# Patient Record
Sex: Female | Born: 1980 | Race: White | Hispanic: No | Marital: Married
Health system: Southern US, Community
[De-identification: ages and names within clinical notes are randomized; demographics above are authoritative.]

---

## 2009-09-23 ENCOUNTER — Emergency Department: Payer: Self-pay | Admitting: Emergency Medicine

## 2012-05-14 ENCOUNTER — Emergency Department: Payer: Self-pay | Admitting: Emergency Medicine

## 2012-09-16 ENCOUNTER — Ambulatory Visit: Payer: Self-pay | Admitting: Obstetrics & Gynecology

## 2012-09-24 ENCOUNTER — Ambulatory Visit: Payer: Self-pay | Admitting: Obstetrics & Gynecology

## 2012-10-11 ENCOUNTER — Ambulatory Visit: Payer: Self-pay | Admitting: Physician Assistant

## 2012-11-12 ENCOUNTER — Ambulatory Visit: Payer: Self-pay | Admitting: Physician Assistant

## 2013-01-01 ENCOUNTER — Emergency Department: Payer: Self-pay | Admitting: Internal Medicine

## 2013-01-01 LAB — URINALYSIS, COMPLETE
Nitrite: NEGATIVE
Ph: 5 (ref 4.5–8.0)
RBC,UR: 8 /HPF (ref 0–5)
Specific Gravity: 1.019 (ref 1.003–1.030)
Squamous Epithelial: 15
WBC UR: 11 /HPF (ref 0–5)

## 2013-01-01 LAB — COMPREHENSIVE METABOLIC PANEL
Albumin: 4.3 g/dL (ref 3.4–5.0)
Alkaline Phosphatase: 78 U/L (ref 50–136)
Anion Gap: 9 (ref 7–16)
BUN: 9 mg/dL (ref 7–18)
Bilirubin,Total: 0.5 mg/dL (ref 0.2–1.0)
Calcium, Total: 8.9 mg/dL (ref 8.5–10.1)
Chloride: 107 mmol/L (ref 98–107)
Creatinine: 0.73 mg/dL (ref 0.60–1.30)
EGFR (African American): >60
EGFR (Non-African Amer.): >60
Glucose: 92 mg/dL (ref 65–99)
Osmolality: 278 (ref 275–301)
SGOT(AST): 26 U/L (ref 15–37)
SGPT (ALT): 19 U/L (ref 12–78)
Total Protein: 8.1 g/dL (ref 6.4–8.2)

## 2013-01-01 LAB — DRUG SCREEN, URINE
Amphetamines, Ur Screen: NEGATIVE (ref ?–1000)
Benzodiazepine, Ur Scrn: NEGATIVE (ref ?–200)
Cannabinoid 50 Ng, Ur ~~LOC~~: NEGATIVE (ref ?–50)
Methadone, Ur Screen: NEGATIVE (ref ?–300)
Opiate, Ur Screen: NEGATIVE (ref ?–300)

## 2013-01-01 LAB — ETHANOL: Ethanol %: <0.003 % (ref 0.000–0.080)

## 2013-01-01 LAB — CBC
MCH: 27 pg (ref 26.0–34.0)
MCV: 83 fL (ref 80–100)
Platelet: 255 10*3/uL (ref 150–440)
RBC: 4.83 10*6/uL (ref 3.80–5.20)

## 2013-01-01 LAB — SALICYLATE LEVEL: Salicylates, Serum: <1.7 mg/dL

## 2013-02-01 ENCOUNTER — Ambulatory Visit: Payer: Self-pay | Admitting: Physician Assistant

## 2013-02-04 ENCOUNTER — Emergency Department: Payer: Self-pay | Admitting: Unknown Physician Specialty

## 2013-02-04 LAB — URINALYSIS, COMPLETE
Bacteria: NONE SEEN
Blood: NEGATIVE
Glucose,UR: NEGATIVE mg/dL (ref 0–75)
Nitrite: NEGATIVE
RBC,UR: 2 /HPF (ref 0–5)
Specific Gravity: 1.033 (ref 1.003–1.030)
WBC UR: 6 /HPF (ref 0–5)

## 2013-02-04 LAB — COMPREHENSIVE METABOLIC PANEL
Albumin: 3.6 g/dL (ref 3.4–5.0)
Alkaline Phosphatase: 49 U/L — ABNORMAL LOW (ref 50–136)
BUN: 11 mg/dL (ref 7–18)
Bilirubin,Total: 0.3 mg/dL (ref 0.2–1.0)
Calcium, Total: 8.4 mg/dL — ABNORMAL LOW (ref 8.5–10.1)
Creatinine: 0.69 mg/dL (ref 0.60–1.30)
EGFR (Non-African Amer.): >60
Glucose: 101 mg/dL — ABNORMAL HIGH (ref 65–99)
Osmolality: 281 (ref 275–301)
Potassium: 4 mmol/L (ref 3.5–5.1)
SGPT (ALT): 19 U/L (ref 12–78)
Sodium: 141 mmol/L (ref 136–145)

## 2013-02-04 LAB — PREGNANCY, URINE: Pregnancy Test, Urine: NEGATIVE m[IU]/mL

## 2013-02-04 LAB — CBC
HCT: 36.8 % (ref 35.0–47.0)
HGB: 11.9 g/dL — ABNORMAL LOW (ref 12.0–16.0)
MCH: 27.2 pg (ref 26.0–34.0)
MCV: 84 fL (ref 80–100)
Platelet: 228 10*3/uL (ref 150–440)
RBC: 4.38 10*6/uL (ref 3.80–5.20)
WBC: 7.8 10*3/uL (ref 3.6–11.0)

## 2013-02-04 LAB — LIPASE, BLOOD: Lipase: 113 U/L (ref 73–393)

## 2013-02-07 ENCOUNTER — Ambulatory Visit: Payer: Self-pay | Admitting: Internal Medicine

## 2013-02-09 ENCOUNTER — Ambulatory Visit: Payer: Self-pay | Admitting: Physician Assistant

## 2013-08-26 LAB — URINALYSIS, COMPLETE
Ph: 5 (ref 4.5–8.0)
Specific Gravity: 1.021 (ref 1.003–1.030)
Squamous Epithelial: 20
WBC UR: 10 /HPF (ref 0–5)

## 2013-08-26 LAB — COMPREHENSIVE METABOLIC PANEL
Alkaline Phosphatase: 52 U/L (ref 50–136)
BUN: 11 mg/dL (ref 7–18)
Co2: 25 mmol/L (ref 21–32)
Creatinine: 0.73 mg/dL (ref 0.60–1.30)
EGFR (African American): >60
EGFR (Non-African Amer.): >60
Glucose: 101 mg/dL — ABNORMAL HIGH (ref 65–99)
SGOT(AST): 20 U/L (ref 15–37)
Sodium: 137 mmol/L (ref 136–145)
Total Protein: 7.6 g/dL (ref 6.4–8.2)

## 2013-08-26 LAB — CBC
HCT: 38.7 % (ref 35.0–47.0)
HGB: 13.1 g/dL (ref 12.0–16.0)
MCH: 27.7 pg (ref 26.0–34.0)
MCV: 82 fL (ref 80–100)
Platelet: 217 10*3/uL (ref 150–440)
RBC: 4.73 10*6/uL (ref 3.80–5.20)
RDW: 15.3 % — ABNORMAL HIGH (ref 11.5–14.5)
WBC: 10.5 10*3/uL (ref 3.6–11.0)

## 2013-08-26 LAB — DRUG SCREEN, URINE
Amphetamines, Ur Screen: NEGATIVE (ref ?–1000)
Benzodiazepine, Ur Scrn: NEGATIVE (ref ?–200)
Cannabinoid 50 Ng, Ur ~~LOC~~: POSITIVE (ref ?–50)
Cocaine Metabolite,Ur ~~LOC~~: NEGATIVE (ref ?–300)
Methadone, Ur Screen: NEGATIVE (ref ?–300)
Opiate, Ur Screen: NEGATIVE (ref ?–300)
Phencyclidine (PCP) Ur S: NEGATIVE (ref ?–25)
Tricyclic, Ur Screen: NEGATIVE (ref ?–1000)

## 2013-08-27 ENCOUNTER — Inpatient Hospital Stay: Payer: Self-pay | Admitting: Psychiatry

## 2013-09-11 IMAGING — CR DG CHEST 2V
1 series · 2 of 2 positions shown · non-contrast
Comparison: none

REASON FOR EXAM: cough
COMMENTS:

[Series 1: pa · 0.17mm/px · 2 of 2 slices shown]
[im 1/2]
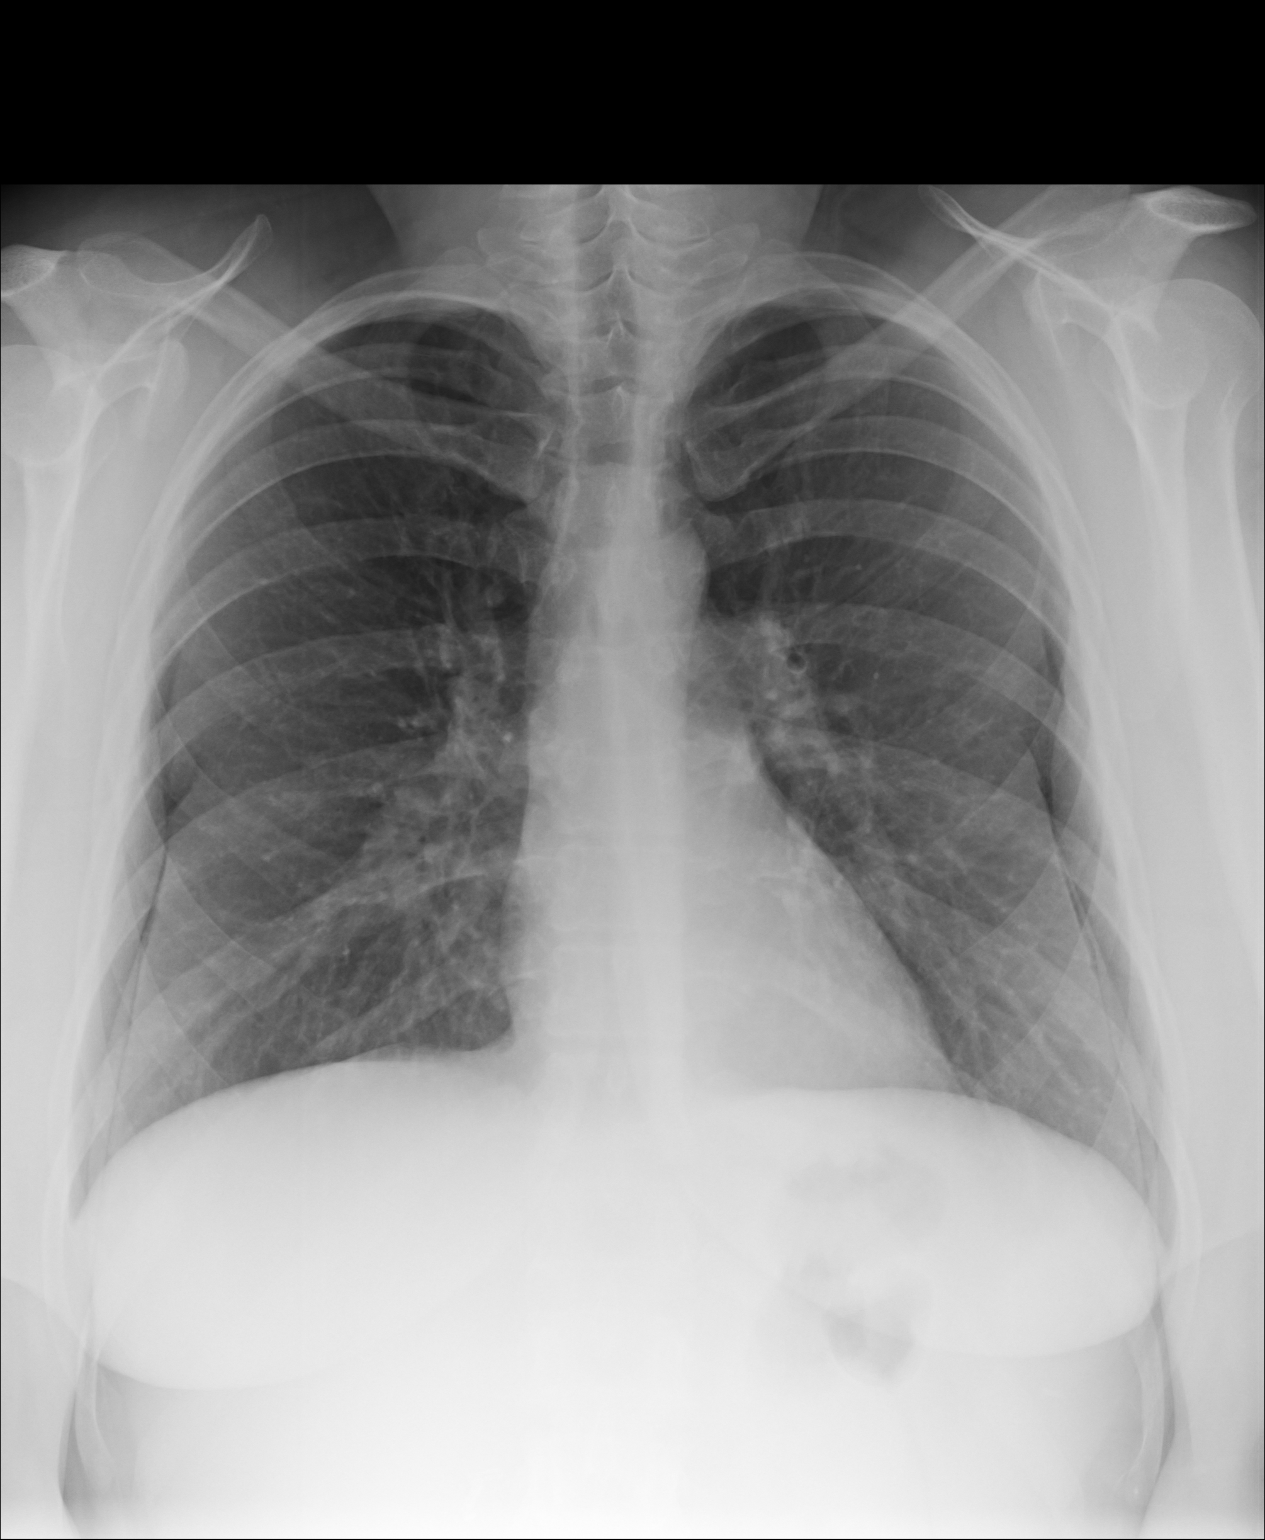
[im 2/2]
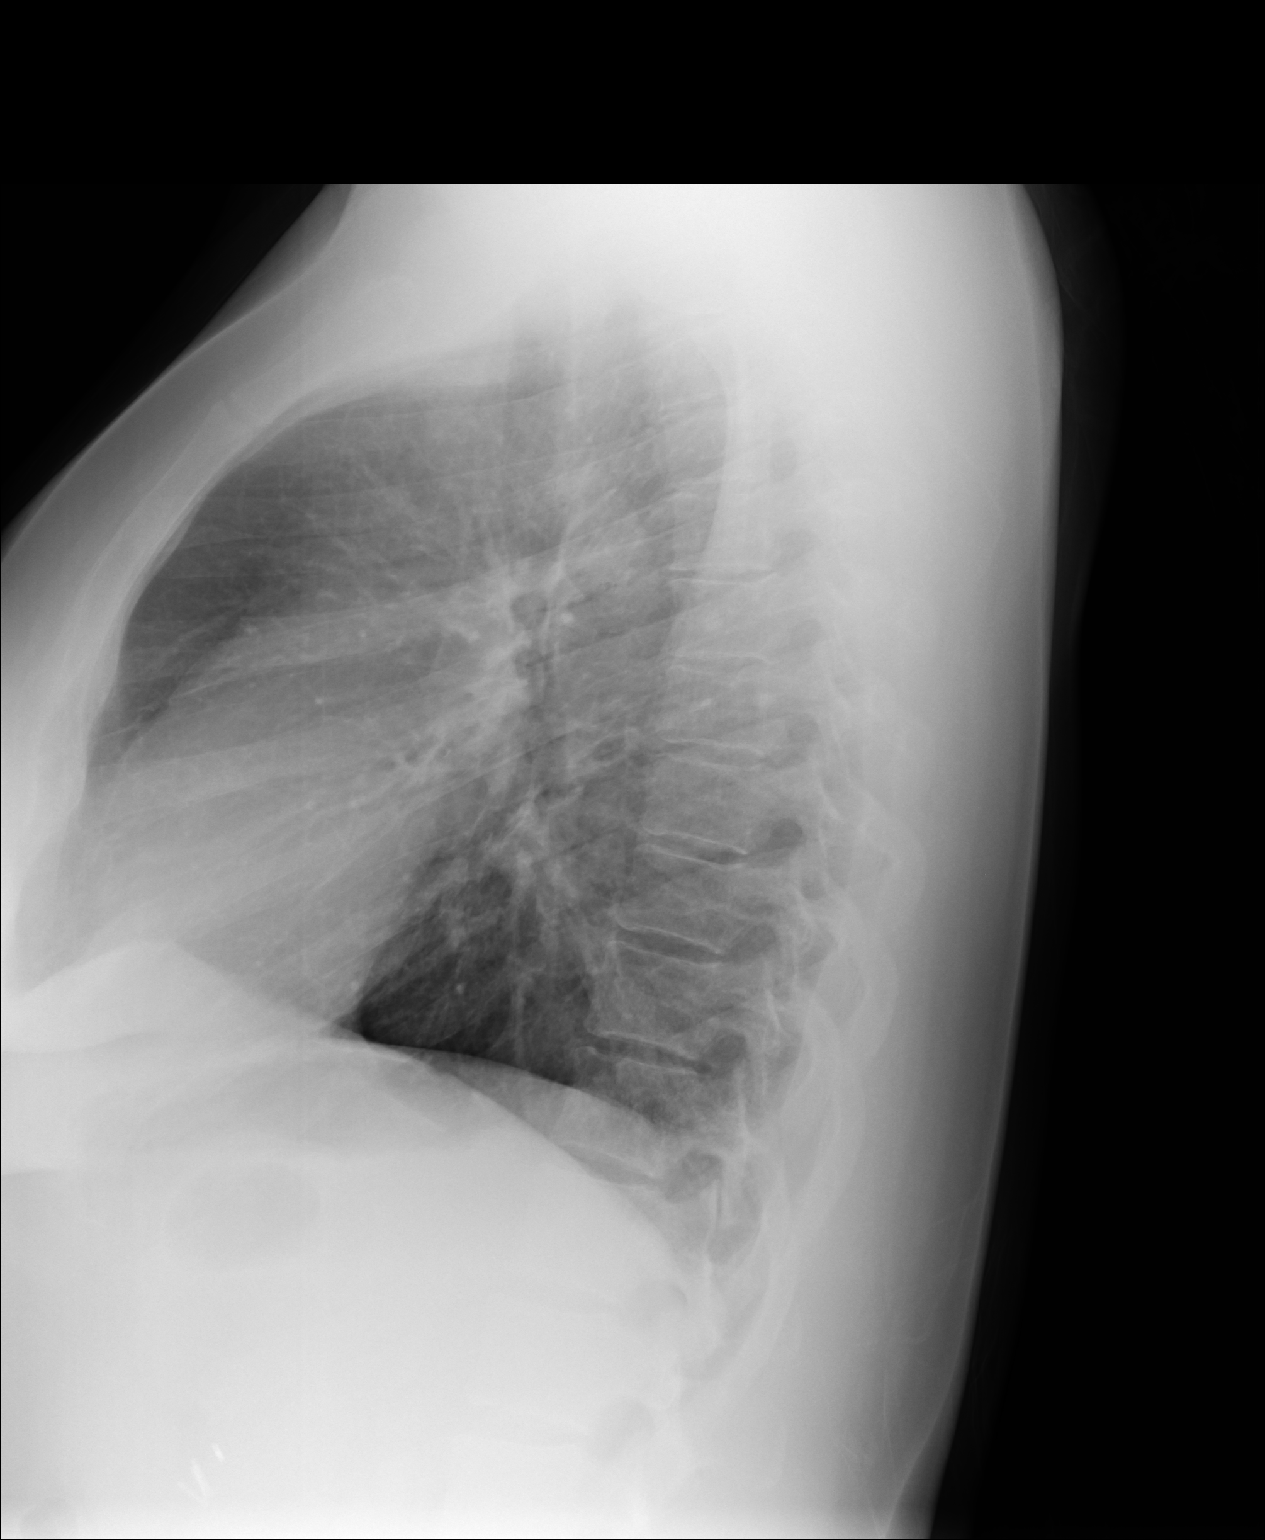

[2 of 2 positions shown; findings below may reference images not displayed]

PROCEDURE:     KDR - KDXR CHEST PA (OR AP) AND LAT  - November 12, 2012 [DATE]

RESULT:     The patient has no previous exam for comparison.

There is mild hyperinflation. The lungs are clear. The heart and pulmonary
vessels are normal. The bony and mediastinal structures are unremarkable.
There is no effusion. There is no pneumothorax or evidence of congestive
failure.
IMPRESSION: No acute cardiopulmonary disease.

[REDACTED]

## 2013-12-01 IMAGING — US ABDOMEN ULTRASOUND LIMITED
1 series · 14 of 25 positions shown · non-contrast
Comparison: none

REASON FOR EXAM: RUQ Abdominal pain and diarrhea WANTS GENERAL NOT LTD
COMMENTS:

[Series 1: abdomen ultrasound limited · 0.30mm/px · 14 of 77 slices shown]
[im 1/77]
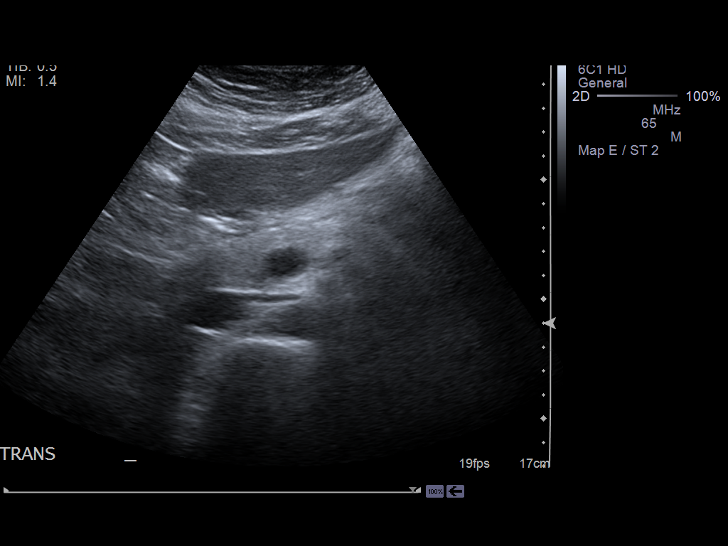
[im 7/77]
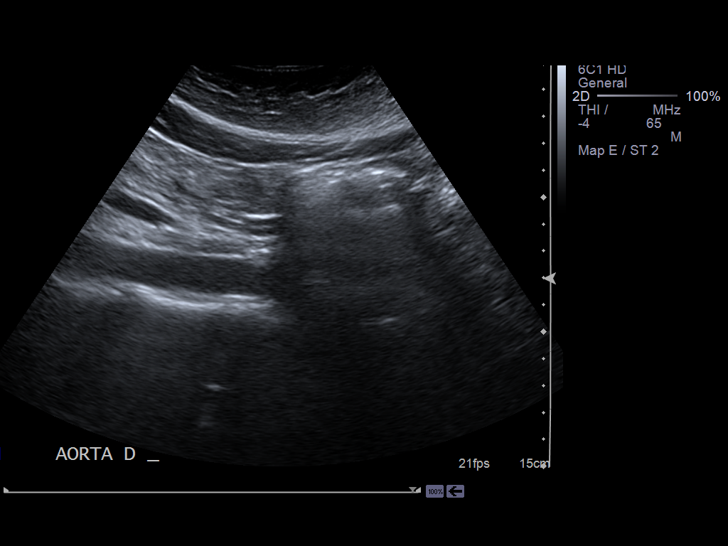
[im 13/77]
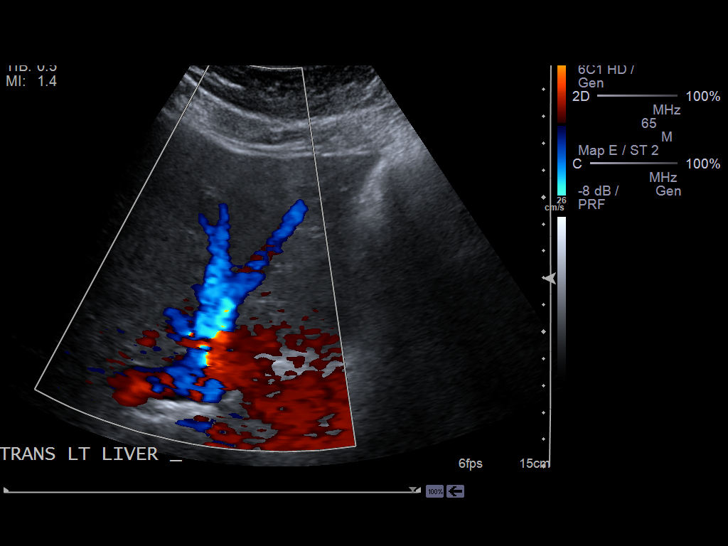
[im 20/77]
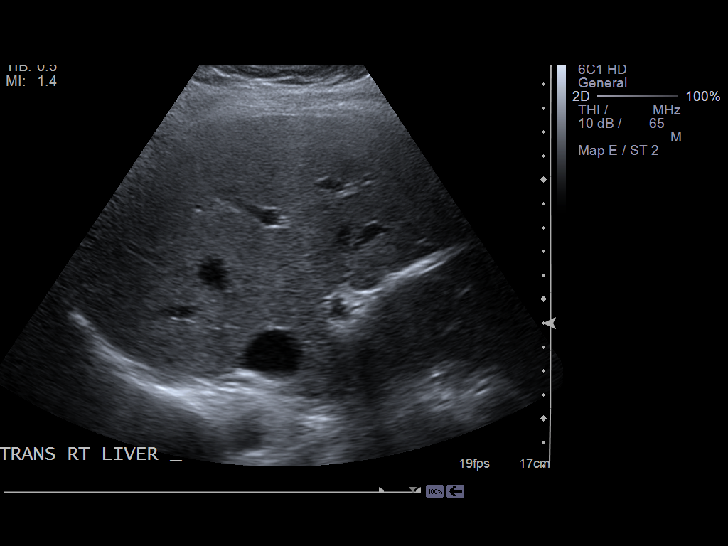
[im 26/77]
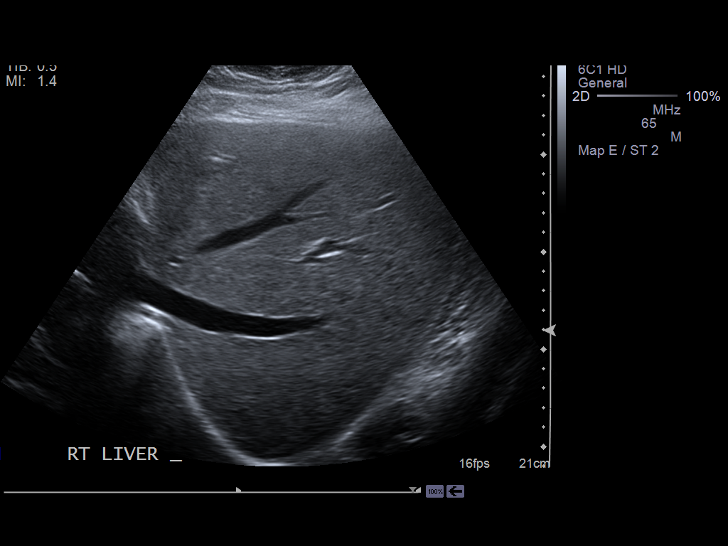
[im 29/77]
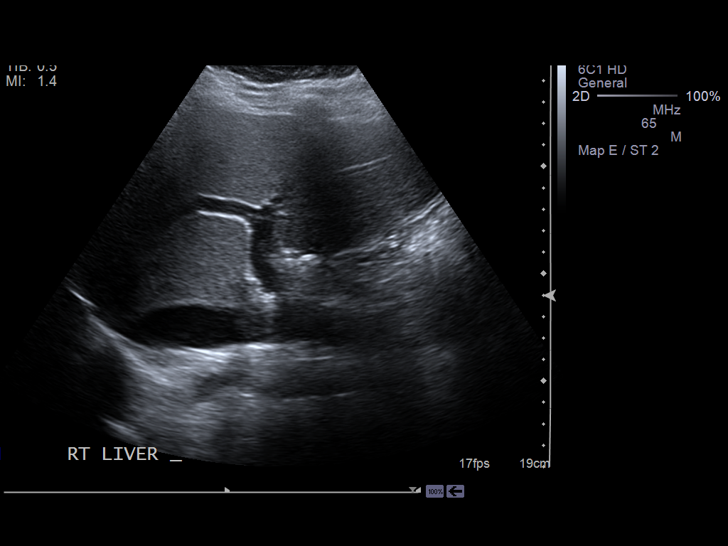
[im 35/77]
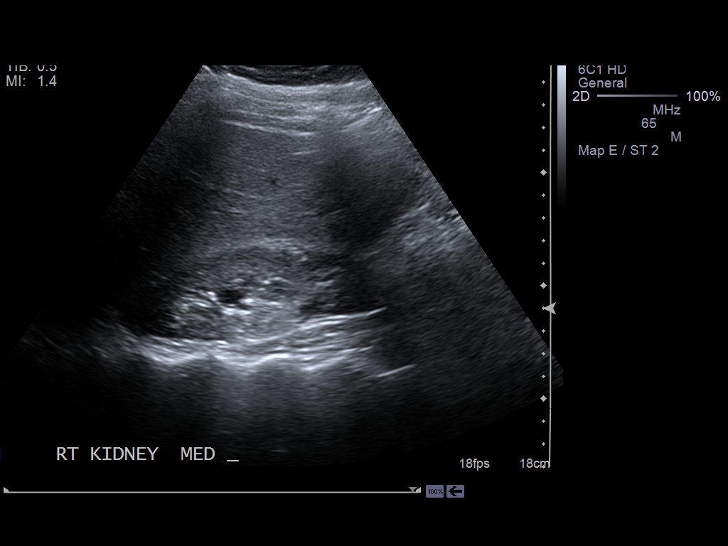
[im 42/77]
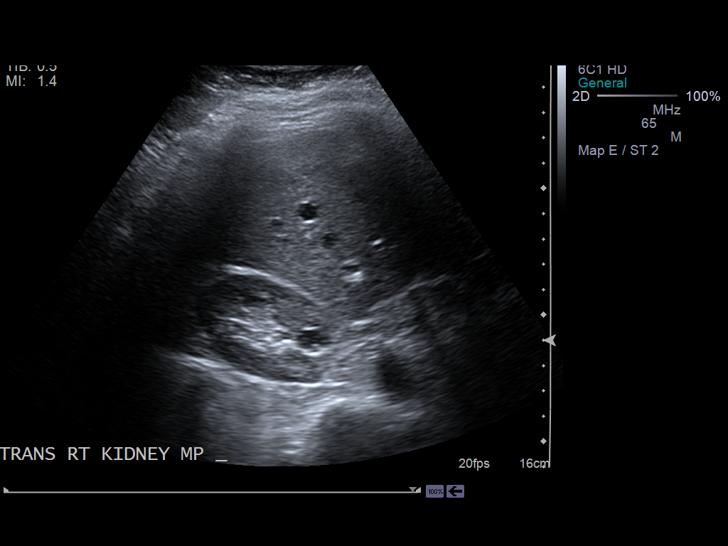
[im 48/77]
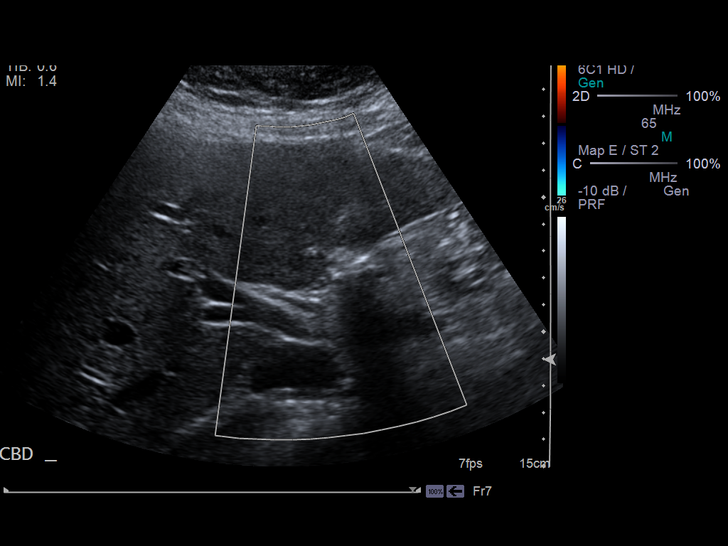
[im 51/77]
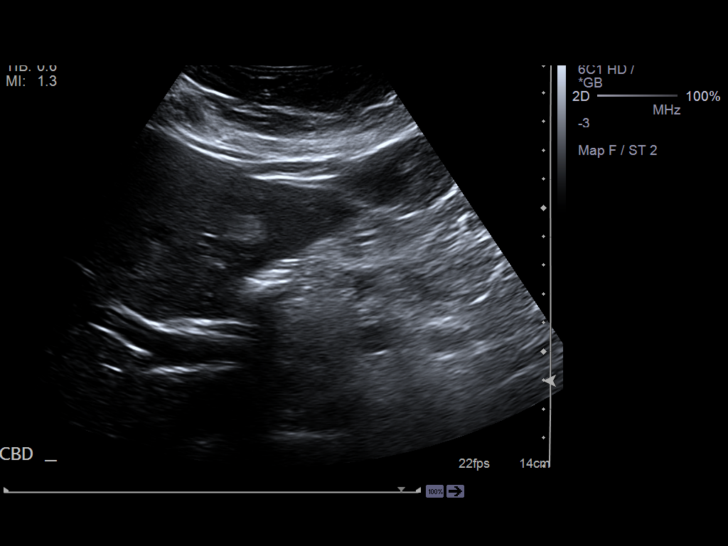
[im 58/77]
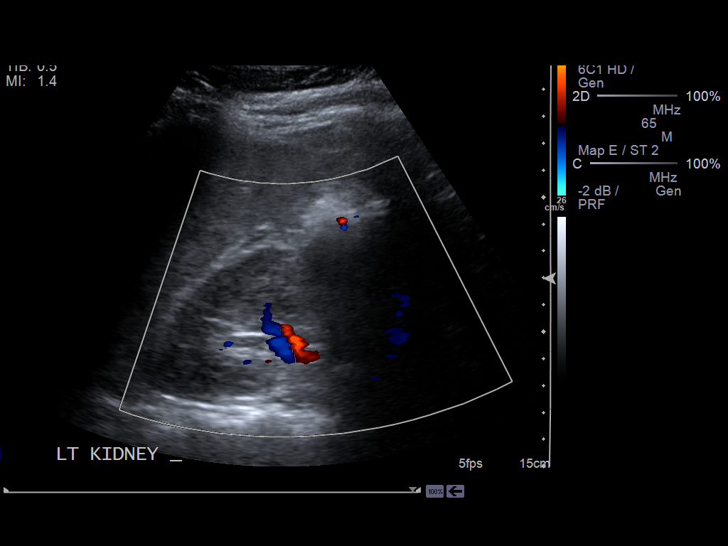
[im 64/77]
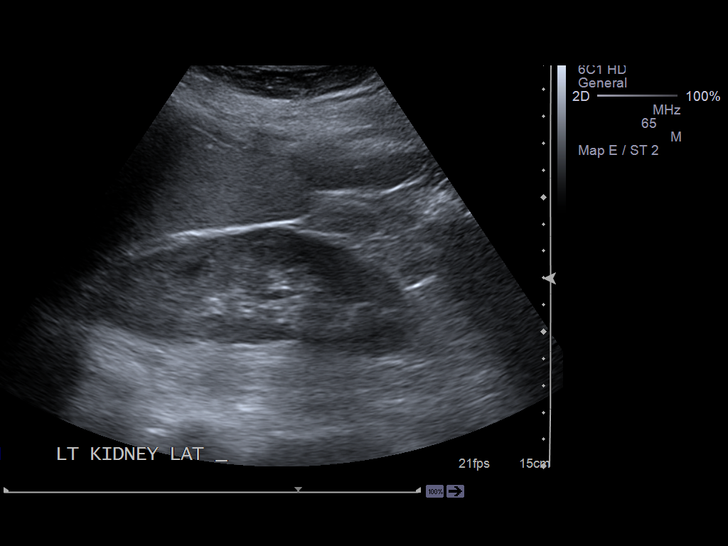
[im 70/77]
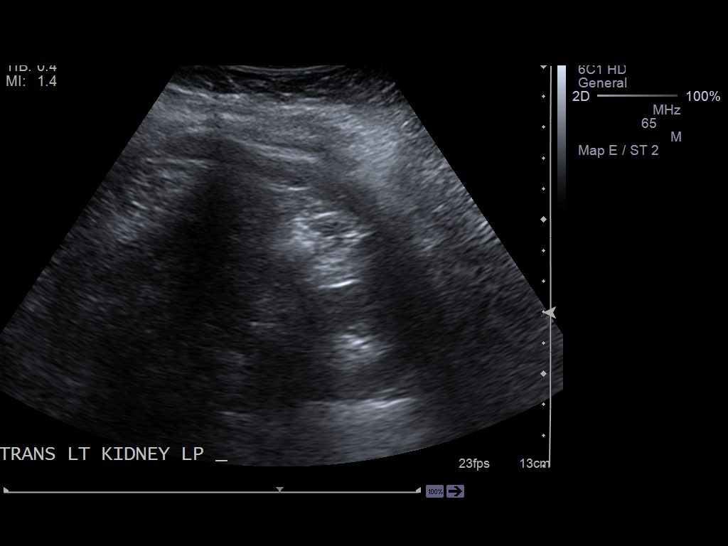
[im 77/77]
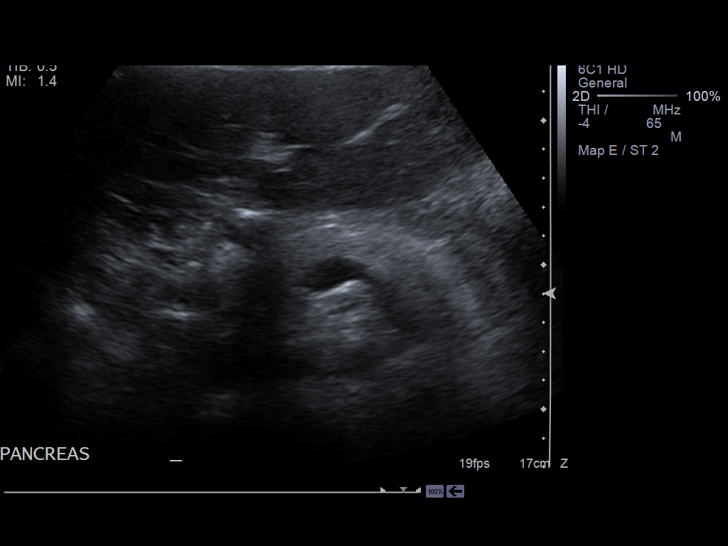

[14 of 25 positions shown; findings below may reference images not displayed]

PROCEDURE:     NHAN - NHAN ABDOMEN UPPER GENERAL  - February 01, 2013  [DATE]

RESULT:     Abdominal ultrasound is performed in the standard fashion. The
patient is status post cholecystectomy in 6212. The head and body the
pancreas appear normal. The tail was not well-seen. The liver is enlarged
measuring up to 20.44 cm in length area there is no intrahepatic biliary
ductal dilation or focal hepatic mass. The spleen is mildly enlarged at
13.66 cm. The aorta and proximal inferior vena cava are normal. Common bile
duct diameter is normal at 3.4 mm. The kidneys are normal.
IMPRESSION: 1. Hepatomegaly with borderline to mild splenomegaly. Limited visualization
of the pancreas. Otherwise grossly normal abdominal sonogram.

[REDACTED]

## 2013-12-05 IMAGING — US US PELV - US TRANSVAGINAL
1 series · 13 of 25 positions shown · non-contrast
Comparison: none

REASON FOR EXAM: rlq pelvic pain
COMMENTS:

[Series 1: us pelv - us transvaginal · 0.33mm/px · 13 of 50 slices shown]
[im 1/50]
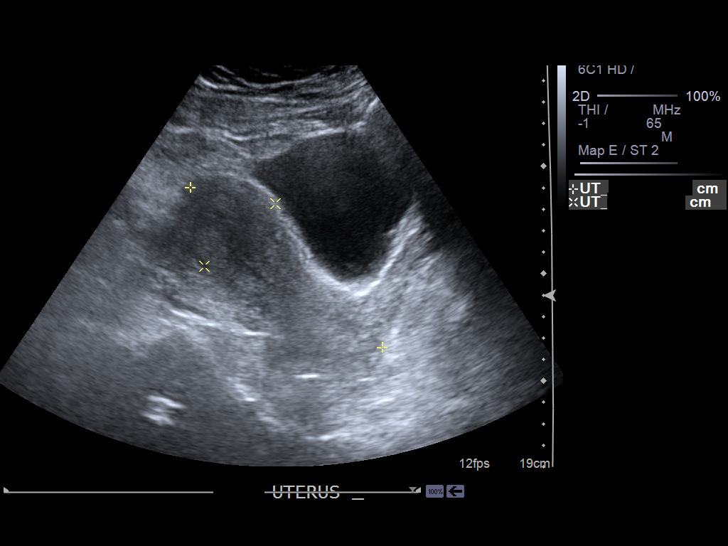
[im 5/50]
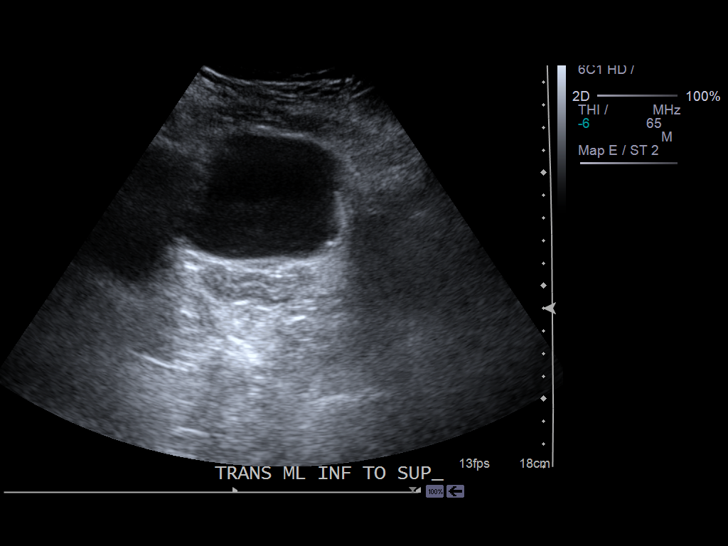
[im 9/50]
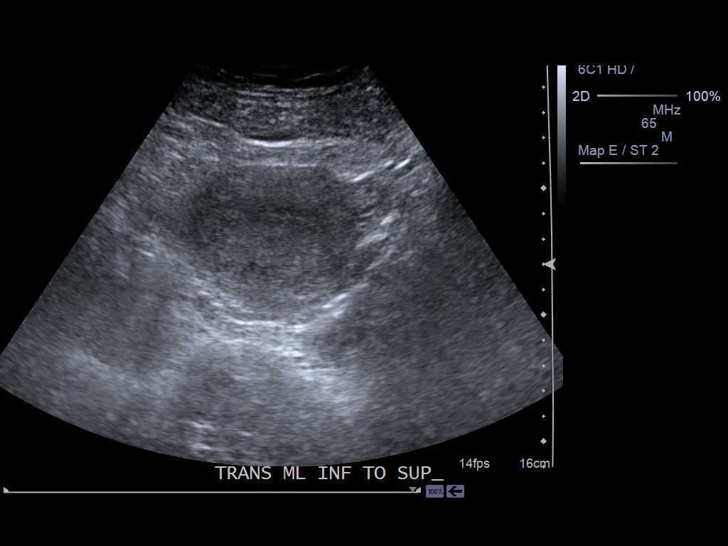
[im 13/50]
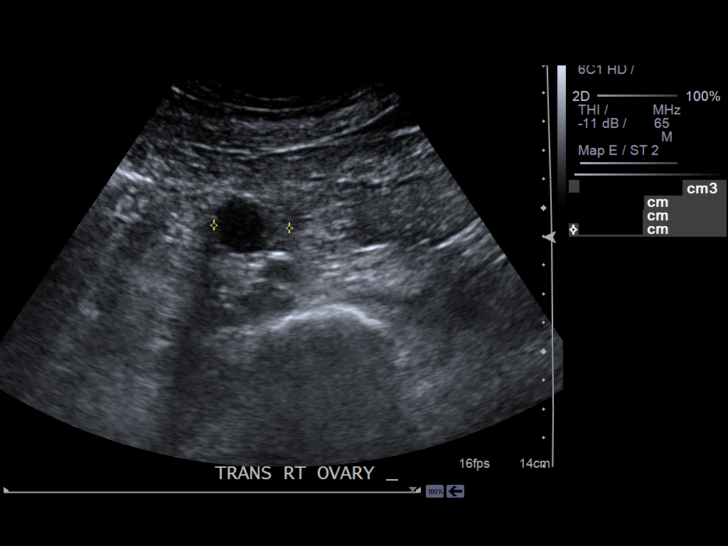
[im 17/50]
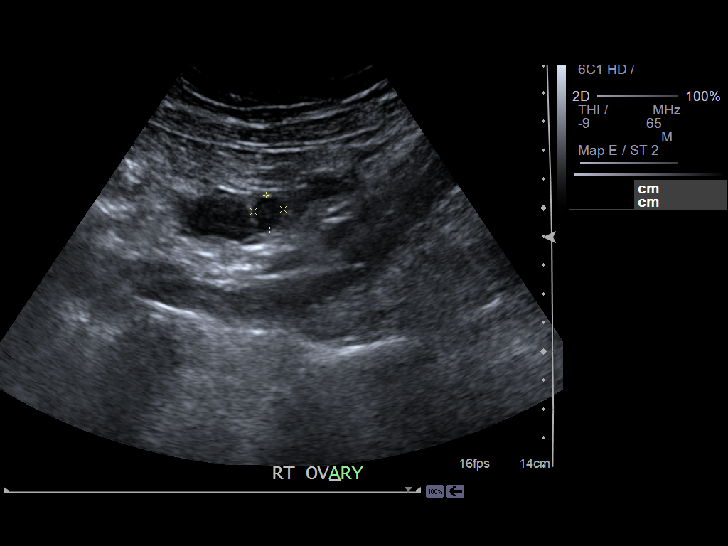
[im 21/50]
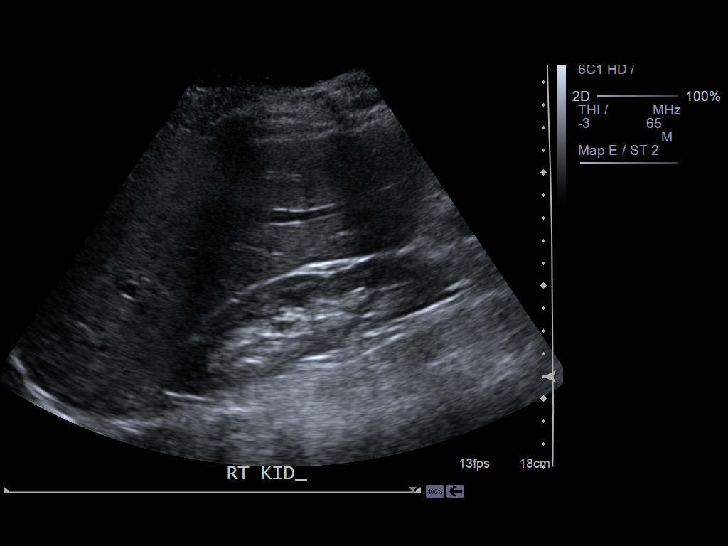
[im 25/50]
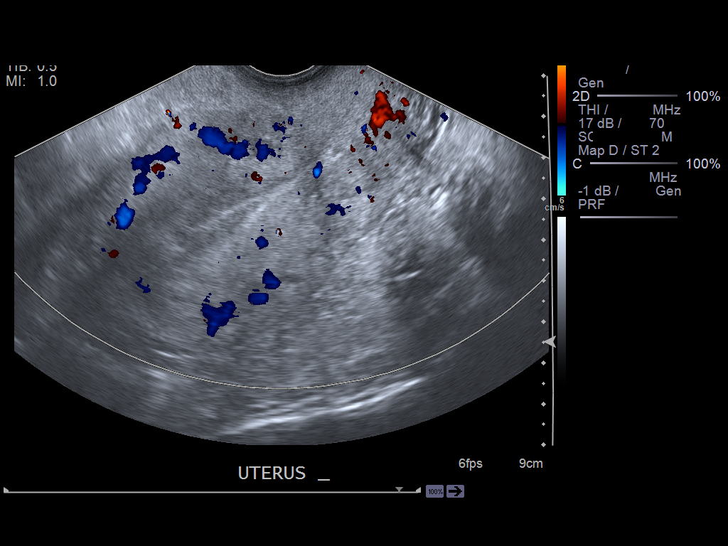
[im 29/50]
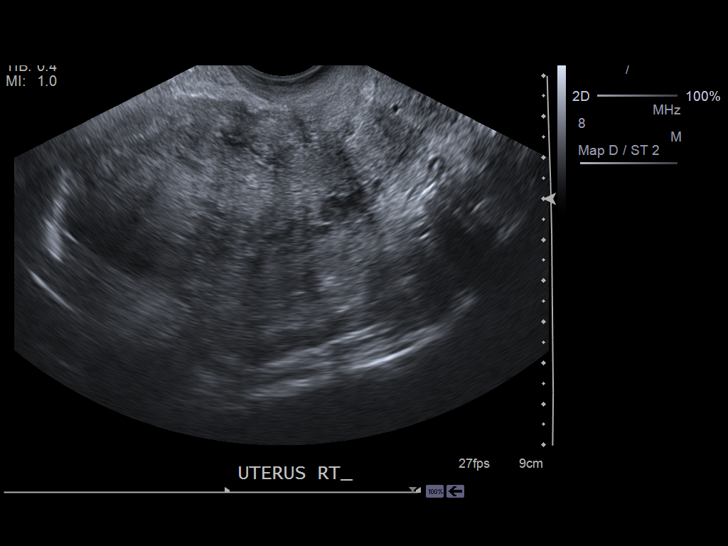
[im 33/50]
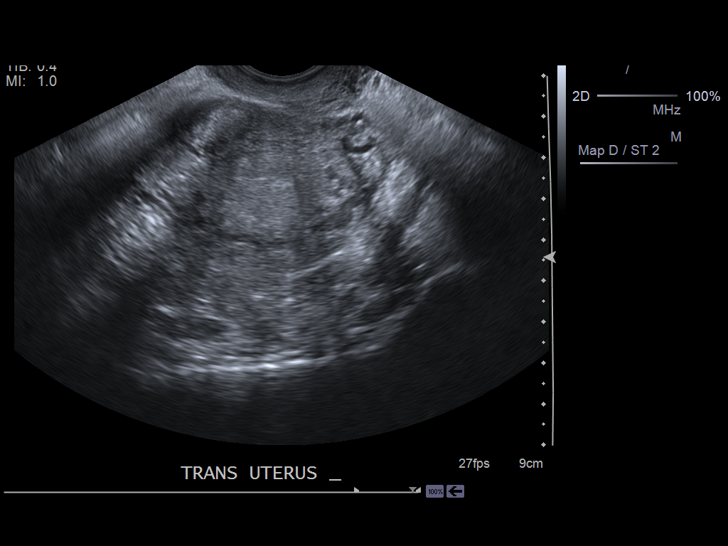
[im 37/50]
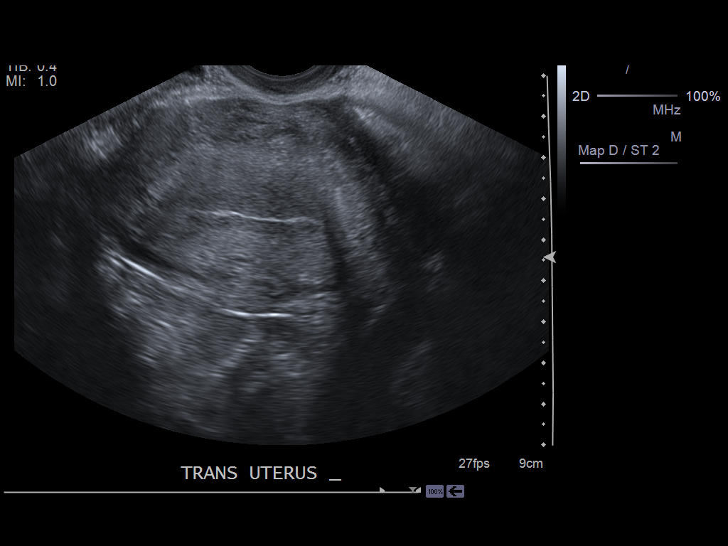
[im 41/50]
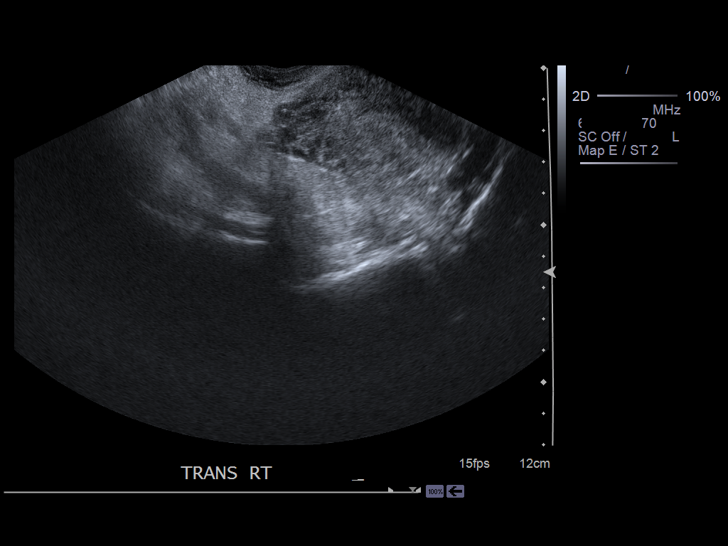
[im 45/50]
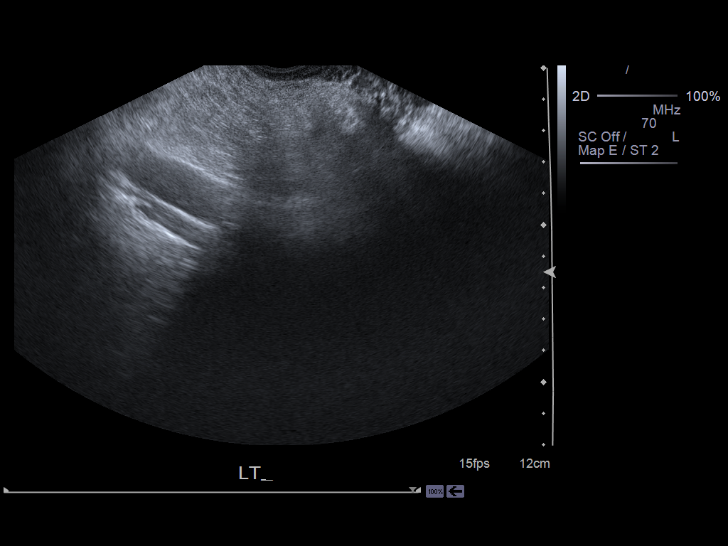
[im 50/50]
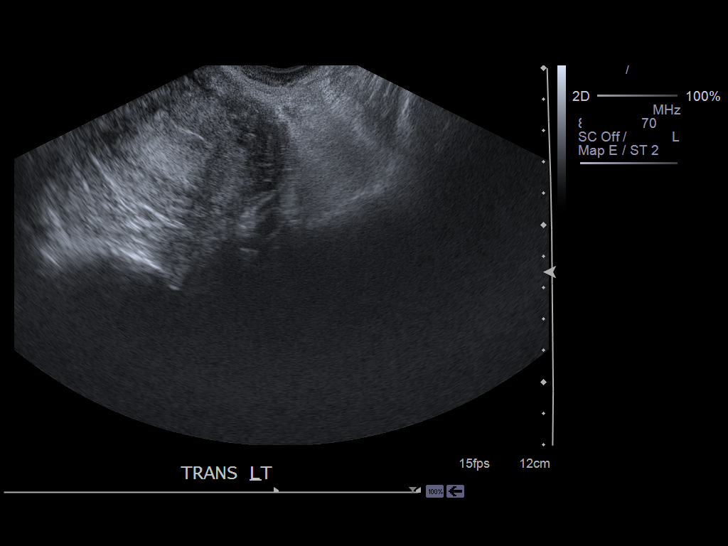

[13 of 25 positions shown; findings below may reference images not displayed]

PROCEDURE:     US  - US PELVIS EXAM W/TRANSVAGINAL  - February 05, 2013  [DATE]

RESULT:     Transabdominal and transvaginal imaging techniques were employed
to evaluate the contents of the pelvis.

The uterus exhibits normal echotexture and measures 11.6 x 6.5 x 4.4 cm. The
endometrial stripe measures under 11 mm. There is a small amount of free
fluid in the cul-de-sac.

The ovaries are visible only transabdominally. The right ovary measures
x 2.1 x 2.3 cm. It contains at least 2 cystic structures. The largest
measures 2.1 x 1.7 x 1.8 cm. A smaller cyst measures approximately 1.2 cm in
diameter. The left ovary measures 2.4 by 1.9 x 1 cm and demonstrates normal
echotexture.

Survey views of the kidneys are normal.
IMPRESSION: 1. There are simple appearing cysts associated with the right ovary.
Otherwise the ovaries are normal in appearance.
2. The uterus is normal in appearance. The endometrial stripe measures just
under 11 mm in thickness. Correlation with the timing of the patient's
menstrual cycle is needed.
3. There is a tiny amount of fluid in the cul-de-sac.

A preliminary report was sent to the [HOSPITAL] the conclusion
of the studies.

[REDACTED]

## 2013-12-09 IMAGING — CR DG RIBS BILAT 3V
1 series · 8 of 8 positions shown · non-contrast
Comparison: none

REASON FOR EXAM: pain in ribs reproducible chest pain
COMMENTS:

PROCEDURE:     KDR - KDXR RIBS BILATERAL  - February 09, 2013  [DATE]
RESULT:     Cholecystectomy clips are present. There is no definite
fracture, bony destruction or foreign body.

[Series 1: pa · 0.17mm/px · 8 of 8 slices shown]
[im 1/8]
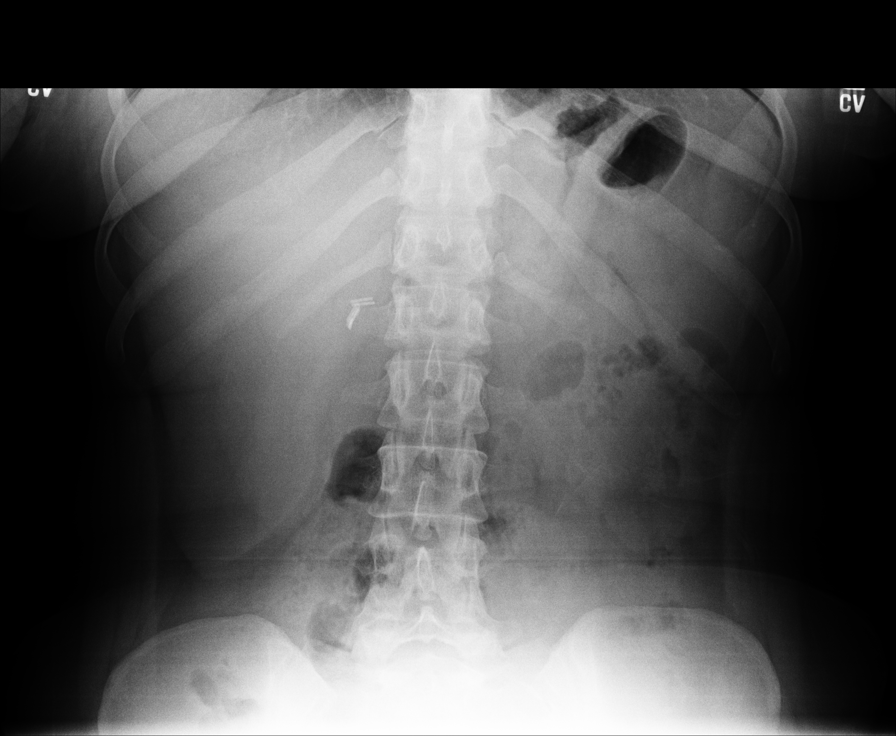
[im 2/8]
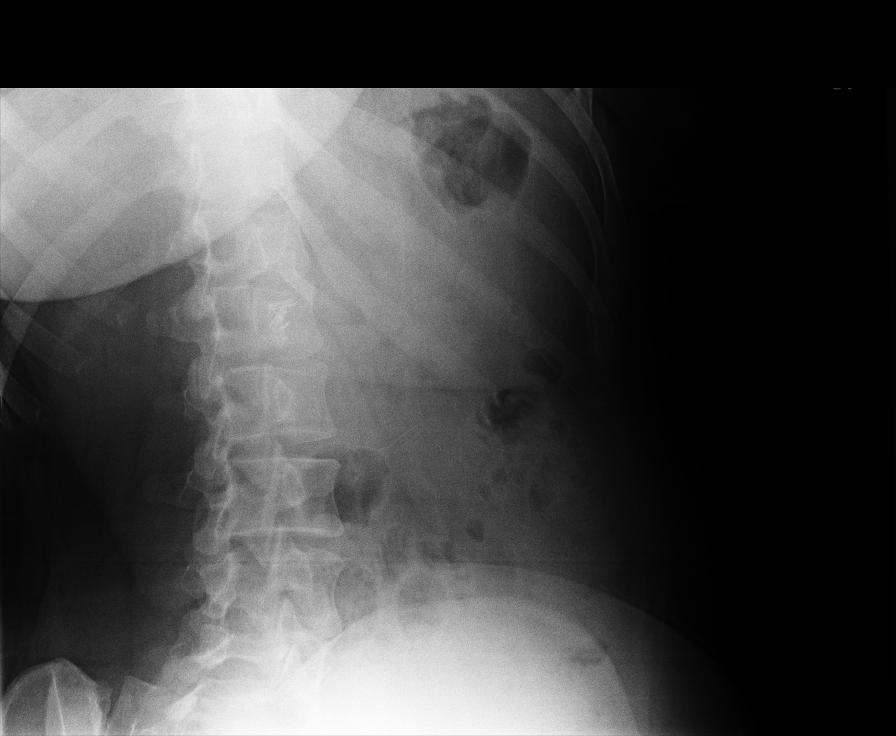
[im 3/8]
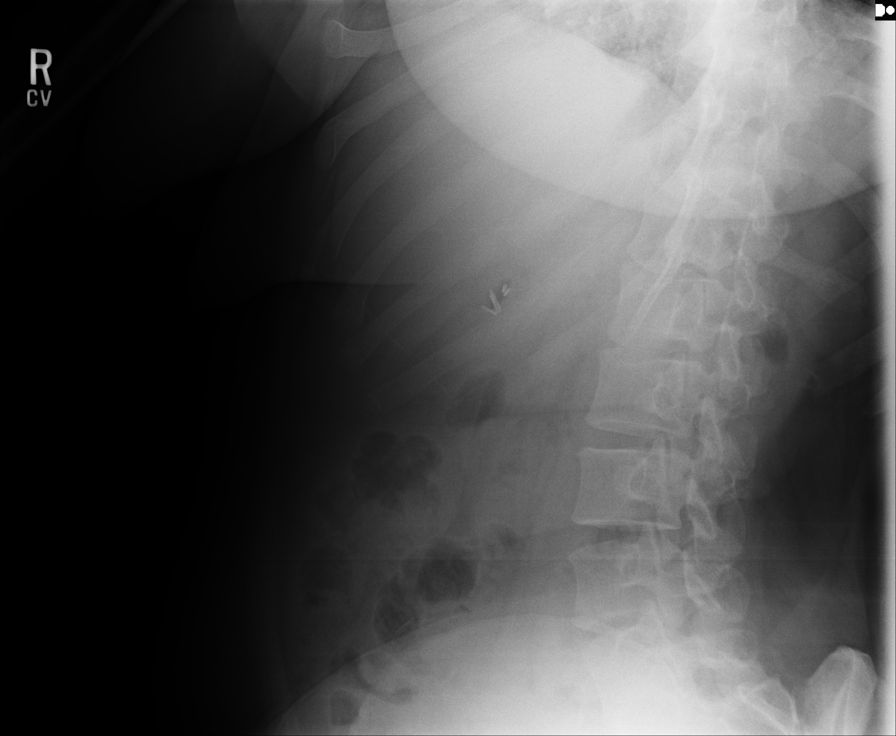
[im 4/8]
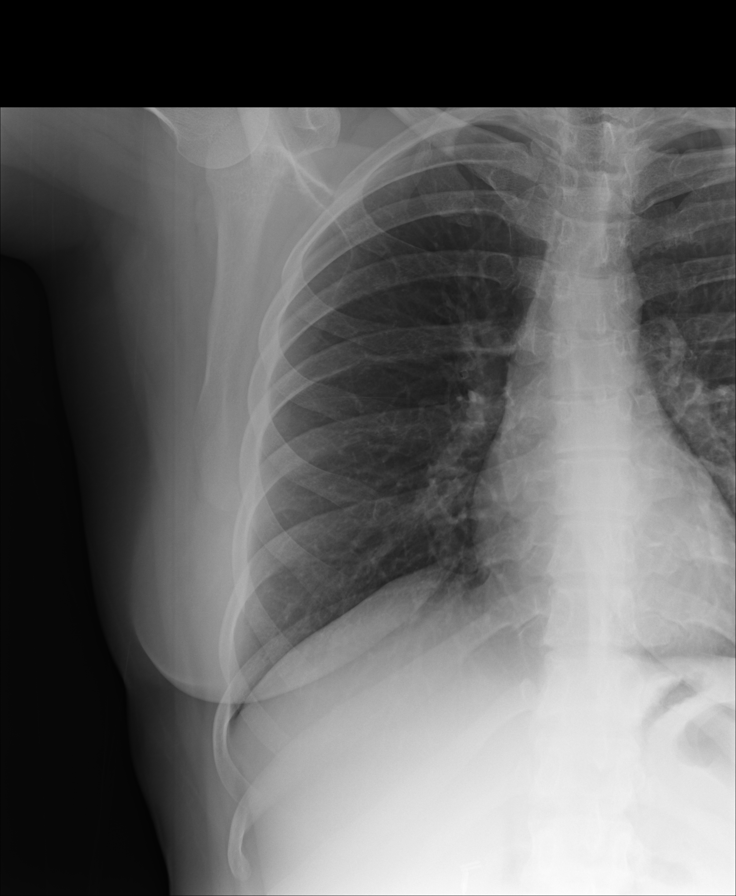
[im 5/8]
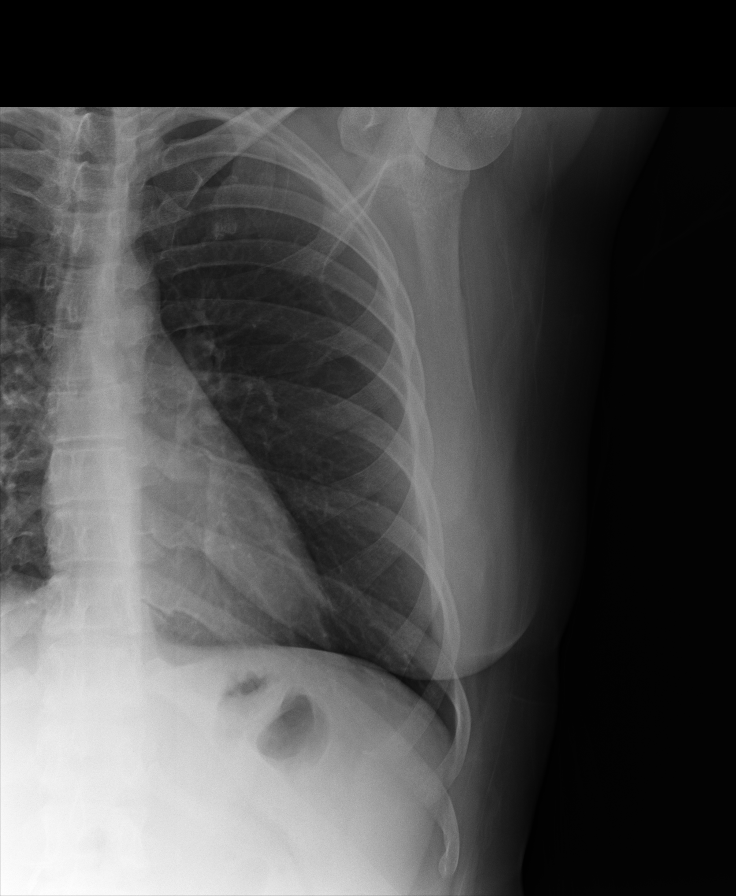
[im 6/8]
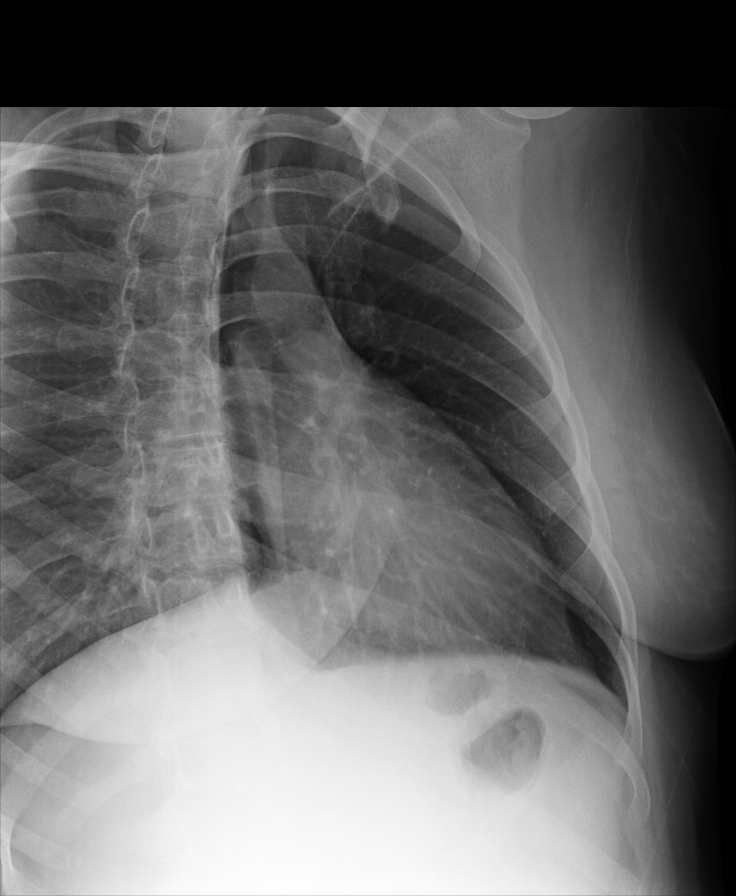
[im 7/8]
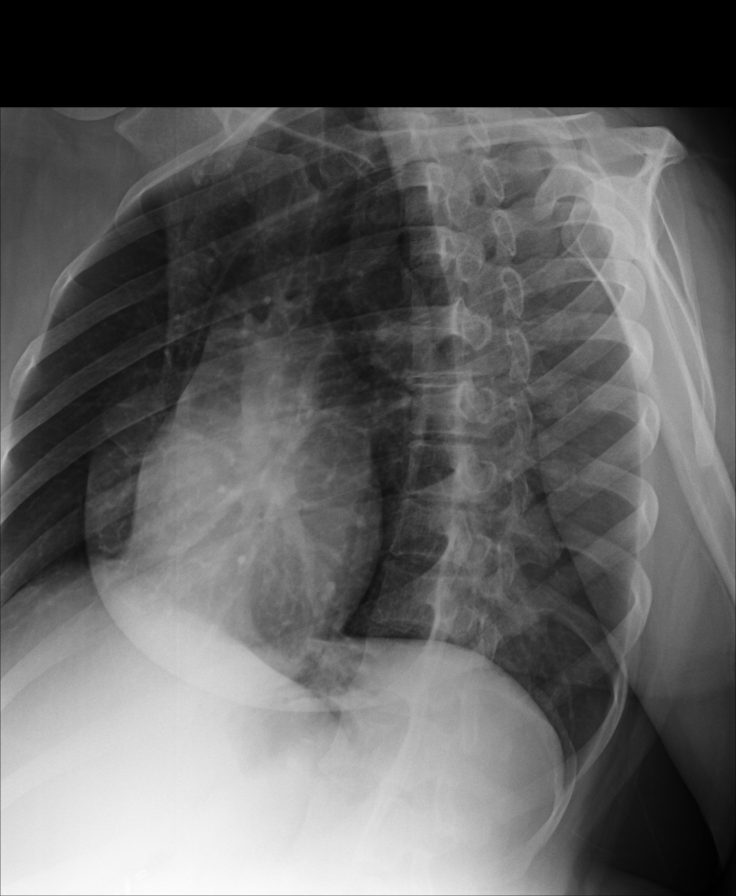
[im 8/8]
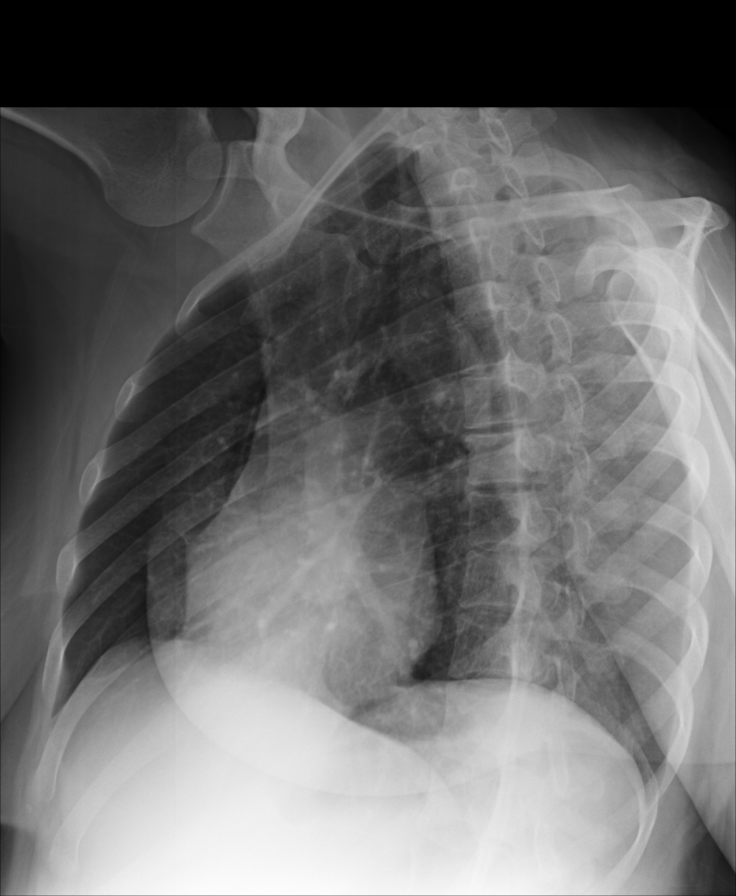

[8 of 8 positions shown; findings below may reference images not displayed]

IMPRESSION: No definite rib fracture evident.

[REDACTED]

## 2015-04-03 NOTE — Op Note (Signed)
PATIENT NAME:  Rebecca Hanna, Rebecca Hanna MR#:  [id number] DATE OF BIRTH:  January 28, 1981  DATE OF PROCEDURE:  09/24/2012  PREOPERATIVE DIAGNOSIS: Undesired fertility.   POSTOPERATIVE DIAGNOSIS: Undesired fertility.   PROCEDURE: Laparoscopic bilateral tubal ligation.  SURGEON: Ali LoweEryn K. Garnette GunnerStansbury Clipp, MD  ANESTHESIA: General with endotracheal.  COMPLICATIONS: None.  ESTIMATED BLOOD LOSS: Minimal.  URINE OUTPUT: 75 mL clear prior to procedure.  SPECIMEN: None.  INDICATIONS: 34 year old G7 P7 with undesired fertility.   FINDINGS: Normal uterus, normal tubes and ovaries bilaterally, no appendix.   PROCEDURE IN DETAIL: Patient was taken to the Operating Room, placed on the Operating Room table and intubated with general anesthesia. She was placed in lithotomy position, her legs in [place] stirrups and her bladder was drained. She was then prepped and draped in sterile fashion. Sterile speculum was inserted into the vagina. Cervix visualized and single tooth tenaculum Hulka was placed in the uterus. Next, 1 cm infraumbilical incision was made vertically in the umbilical plate. The Veress needle was inserted into the abdomen. Saline injection, aspiration and hanging drop test all revealed intraperitoneal placement. Gas was started on low flow with pressure less than 10 and high flow up to 15 mmHg pressure. Next, a 5 mm trocar Visiport was used to enter the abdominal cavity under direct visualization. The obturator was removed and the camera reinserted and the pelvis was inspected with findings noted above. Next, 8 mm port was placed suprapubically under direct visualization and without difficulty. The left fallopian tube was then grasped using the Falope ring applier, followed out to the fimbriae and a mid isthmic portion was then grasped with the Falope ring applicator. A single Falope ring was applied without difficulty to a knuckle of the tube and blanching was noted. Attention was then turned to the right side  where the above procedure was carried out in a similar fashion and without complication. The pelvis was then again surveyed, found to be hemostatic. All instruments and trocars were then removed from the abdomen. Gas was allowed to escape. The incision of skin was closed using Dermabond and bandaged with Telfa and Tegaderm. All counts are correct x2. Patient tolerated the procedure and was taken to the PAC-U in stable condition.   ____________________________ Ali LoweEryn K. Garnette GunnerStansbury Clipp, MD eks:cms D: 09/24/2012 15:04:31 ET T: 09/24/2012 15:19:20 ET JOB#: [id number]  cc: Weston SettleEryn K. Garnette GunnerStansbury Clipp, MD, <Dictator> Marlaine HindERYN K STANSBURY CLIPP MD ELECTRONICALLY SIGNED 10/15/2012 10:07

## 2015-04-06 NOTE — Discharge Summary (Signed)
PATIENT NAME:  Rebecca Hanna, Rebecca Hanna MR#:  [id number] DATE OF BIRTH:  Dec 07, 1981  DATE OF ADMISSION:  08/27/2013 DATE OF DISCHARGE:  09/02/2013  HOSPITAL COURSE:  See dictated history and physical for details of admission.  This 34 year old woman with a history of suicide attempts and chronic depression and anxiety was admitted to the hospital with evidence of self-mutilation with suicidal ideation with disorganized behavior and dysphoric mood.  She was treated in the hospital with medication based on what had previously been successful for her.  She was engaged in daily individual and group psychotherapy.  Initially she was withdrawn and tearful, but gradually became more relaxed and started to interact more appropriately on the unit.  She did not engage in any dangerous behavior while in the hospital.  She worked appropriately on making plans for discharge.  She has no stable place to stay other than the homeless shelter, but is agreeable to going back there.  Her check should be coming next week at which time she will have more options.  The patient was educated about the importance of staying on medications, staying away from substance abuse and staying active in outpatient treatment and agrees to the treatment plan.   DISCHARGE MEDICATIONS:  Ibuprofen 800 mg every eight hours as needed for headaches and pain, Tegretol 200 mg twice a day, trazodone 100 mg at night, citalopram 40 mg per day.   MENTAL STATUS EXAMINATION AT DISCHARGE:  Casually dressed, reasonably well-groomed woman who looks her stated age.  Alert and oriented x 4.  Good eye contact, normal psychomotor activity.  Speech is normal in rate, tone and volume.  Affect smiling, upbeat, relaxed.  Mood stated as being (Dictation Anomaly) <<goodMISSING TEXT>>.  Thoughts are lucid without any loosening of associations or delusions.  Denies auditory or visual hallucinations.  Denies suicidal or homicidal ideation.  Agrees to outpatient treatment in the  community.  Normal intelligence.  Alert and oriented x 4.   LABORATORY RESULTS:  Admission labs showed a cannabis positive drug screen.  TSH normal at 0.9, alcohol undetected, minor abnormalities of no significance on the chemistry panel.  Hematology panel entirely normal.  Urinalysis positive for likely urinary tract infection which was treated with Septra while she was in the hospital.  Tegretol level 8.7 prior to discharge.   DIAGNOSIS, PRINCIPAL AND PRIMARY:  AXIS I:  Mood disorder, not otherwise specified.   SECONDARY DIAGNOSES: AXIS I:  Cannabis abuse.  AXIS II:  Borderline personality disorder.  AXIS III:  Urinary tract infection, resolved.  Chronic pain.  AXIS IV:  Severe from homelessness.  AXIS V:  Functioning at time of discharge is 55.    ____________________________ Rebecca AmelJohn T. Jacoya Bauman, MD jtc:ea D: 09/02/2013 23:49:41 ET T: 09/03/2013 03:06:19 ET JOB#: [id number]  cc: Rebecca AmelJohn T. Rebecca Foley, MD, <Dictator> Rebecca AmelJOHN T Rebecca Macinnes MD ELECTRONICALLY SIGNED 09/03/2013 10:38

## 2015-04-06 NOTE — H&P (Signed)
PATIENT NAME:  Rebecca Hanna, Rebecca Hanna MR#:  [id number] DATE OF BIRTH:  1981-11-09  DATE OF ADMISSION:  08/26/2013  CONSULTING PHYSICIAN: Audery AmelJohn T. Nevaen Tredway, Hanna.D.    IDENTIFYIN INFORMATION AND CHIEF COMPLAINT:  A 34 year old woman with a history of depression and self-injury who brought herself to the Emergency Room.   CHIEF COMPLAINT: " I could not take it there."   HISTORY OF PRESENT ILLNESS: The patient says that she had herself brought to the Emergency Room because she has been feeling increasingly depressed. She claims that at times, she is having hallucinations, but in any case, she is having thoughts about hurting herself. She shows me where she cut her left arm superficially several times yesterday. Her frustration has been getting worse, because the place where she is living with her boyfriend and his family, she feels like they are treating her as a prisoner. According to her, they keep her pinned up inside of the house and want even let her sit out on the porch. The reason for this is that she is currently out jail on bond and has a history of impulsively running away and they are afraid she is going to skip her bond. The patient says that she is not taking her medication. She smokes marijuana regularly. She does not go to any outpatient treatment. She feels tired, down and depressed. She has big plans about wanting to go to [place] or get her Medicaid reassigned from [place] up here, , but has not followed up on any of it.   PAST PSYCHIATRIC HISTORY: Has had a long history of self-mutilation and impulsive behavior. She claims she has not been on inpatient psychiatric units before. She has been prescribed various antidepressants. Most recently, she was prescribed Celexa and Tegretol from our Emergency Room. She says recently she is not taking any medicine. She indicates that she has had multiple self-mutilations; it is not clear if she has ever seriously tried to kill herself.   PAST MEDICAL HISTORY: The  patient is overweight, and has a history of having had scabies in the past. Right now, she is not complaining of any of that. No known significant ongoing medical problems.   SOCIAL HISTORY: The patient has had a chaotic life. She has 7 children; none of whom are in her custody. She is on disability, apparently for mental health problems. She has stayed at homeless shelters here recently, but never really participates in any of the programming. She seems to be very impulsive with her money. Right now, apparently she is out on bond on some kind of criminal charge.   SUBSTANCE ABUSE HISTORY: She claims that while she smokes pot almost every day, that she does not drink or abuse any other drugs.   REVIEW OF SYSTEMS: Anxious mood, possible vague auditory hallucinations, positive suicidal ideation and wish to harm herself, fatigue. No specific physical symptoms otherwise.   CURRENT MEDICATIONS: None.   ALLERGIES: No known drug allergies.   MENTAL STATUS EXAMINATION: Disheveled woman, looks her stated age. Possibly, cooperative with the interview. Intermittent eye contact. Sluggish psychomotor activity. Speech overall normal in rate, tone and volume. Affect is reactive, but a little bit flat. Mood is stated as being depressed. Thoughts appear to be slow and simple. Did not make any obviously bizarre or delusional statements. Denies auditory or visual hallucinations right now, but says that she has had some voices recently. Denies any homicidal ideation, but indicates she does have suicidal thoughts, which are vague in nature. Intelligence is  probably low to low average. Judgment and insight poor.   PHYSICAL EXAMINATION: GENERAL: Overweight woman. She has new superficial scratches on her left forearm. Multiple old scars from self-cutting on her left arm. No other identified skin changes.  HEENT: Pupils equal and reactive. Face symmetric. Oral mucosa normal.  NECK AND BACK: Nontender.  MUSCULOSKELETAL:  Full range of motion at all extremities. Normal gait. Strength and reflexes appear to be symmetric and normal throughout.  NEUROLOGICAL: Cranial nerves symmetric and normal.  LUNGS: Clear without wheezes.  HEART: Regular rate and rhythm. No extra sounds.  ABDOMEN: Soft, nontender, normal bowel sounds.  VITAL SIGNS: Most recent vital signs show temperature of 98, pulse 87, respirations 18, blood pressure 110/75.   LABORATORY RESULTS: Drug screen positive for cannabis. Pregnancy test negative. Urine has blood in it, but also looks quite infected. TSH normal at 0.9. Alcohol undetected. Chemistry panel largely insignificant. CBC normal.   ASSESSMENT: A 33 year old woman who probably mostly has personality disorder and low IQ, possible developmental disability. She has gotten frustrated and anxious in her living situation and reports suicidal ideation. Does not have any other place to stay at the moment, although she might be able to go back to the shelter. Has been off her medicine and noncompliant. She could probably be started back on medication. Reasonable to admit to the hospital, although at the moment, we do not have any beds available that I know of.   TREATMENT PLAN: I am going to go ahead and admit her to the hospital because we may get another nursing staff later this weekend. Start her back on Celexa. Treat the urinary tract infection. Psychoeducation and medical education done with the patient.   DIAGNOSIS, PRINCIPAL AND PRIMARY:   AXIS I: Mood disorder, not otherwise specified.   SECONDARY DIAGNOSES: AXIS I: Marijuana abuse.   AXIS II: Rule out developmental disability, but also borderline personality disorder.   AXIS III: No ongoing medical issues.   AXIS IV: Severe from homelessness.   AXIS V: Functioning at time of evaluation, 40.     ____________________________ Audery Amel, MD jtc:nts D: 08/27/2013 00:19:33 ET T: 08/27/2013 00:58:58 ET JOB#: [id number]  cc: Audery Amel, MD, <Dictator> Audery Amel MD ELECTRONICALLY SIGNED 08/29/2013 9:35

## 2015-04-06 NOTE — Consult Note (Signed)
PATIENT NAME:  Rebecca Hanna, [name] MR#:  [id number] DATE OF BIRTH:  02-Nov-1981  DATE OF CONSULTATION:  01/04/2013  REQUESTING PHYSICIAN:  Sheryl L. Mindi JunkerGottlieb, MD CONSULTING PHYSICIAN:  Eden Toohey B. Britnay Magnussen, MD  REASON FOR CONSULTATION:  To evaluate a depressed patient.   IDENTIFYING DATA:  The patient is a 34 year old female with a history of depression.   CHIEF COMPLAINT: "I'm fine now."   HISTORY OF PRESENT ILLNESS:  The patient [place] been a [place] of [place]. She left the shelter for the second time and upon her return, she was told that she no longer may stay at the shelter and has to wait 90 days before she is allowed back. Feeling homeless, she came to the Emergency Room complaining of worsening of depression and suicidal ideation. At that time, we did not have a bed on psychiatry unit and the patient was maintained in the Emergency Room. By the time I saw her, the patient was no longer suicidal. Her assessment was delayed by the fact that the patient was infected with scabies and lice and received treatment in the Emergency Room twice before she was cleaned of parasites. The patient recently started working with [facility] and was prescribed Celexa. She reports good compliance with treatment. She probably would not seek admission to psychiatry if not for loss of housing. We called shelter staff and they reported that this is the second time the patient left the shelter and tried to come back. She is not participating with any of the shelter programs that are meant to help the patient become independent. The first time she left the shelter was over Christmas to spend time with her family and the second time she left with a man hoping that they would stay together, but it did not work out. The patient has several plans; one of them to catch a bus to [place] where she has family and friends, but it will not be possible until the end of the month when she  gets her paycheck. She also has been trying to get in touch with her family here and see if she can spend a few days with them prior to her moving to [place]. She initially was endorsing suicidal ideation and some paranoia. By the time I assessed her, she was no longer suicidal, homicidal or psychotic. She did not complain of excessive anxiety. She was tolerating the Celexa well. She denied any alcohol, illicit substance or prescription pill abuse.   PAST PSYCHIATRIC HISTORY:  She denies ever being hospitalized.   FAMILY PSYCHIATRIC HISTORY:  None reported.   PAST MEDICAL HISTORY:  None.   ALLERGIES:  No known drug allergies.   MEDICATIONS ON ADMISSION:  Celexa 40 mg daily.   SOCIAL HISTORY:  She has 7 children. All of her children stay with their fathers. She does not have any of the kids in her care. She is homeless. She is on disability for reportedly depression. She does not manage her money wisely. I am afraid that once she gets a check she will spend it before going to [place]. She dropped out of school in the 9th grade due to learning difficulties.   REVIEW OF SYSTEMS:  CONSTITUTIONAL: No fevers or chills. No weight changes.  EYES: No double or blurred vision.  ENT: No hearing loss.  RESPIRATORY: No shortness of breath or cough.  CARDIOVASCULAR: No chest pain or orthopnea.  GASTROINTESTINAL: No abdominal pain, diarrhea or constipation.  GENITOURINARY:  No incontinence or frequency.  ENDOCRINE: No heat or cold intolerance.  LYMPHATIC: No anemia or easy bruising.  INTEGUMENTARY: No acne or rash.  MUSCULOSKELETAL: No muscle or joint pain.  NEUROLOGIC: No tingling or weakness.  PSYCHIATRIC: See history of present illness for details.   PHYSICAL EXAMINATION: VITAL SIGNS: Blood pressure 119/58, pulse 97, respirations 18, temperature 98.6.  GENERAL: This is a well-developed female in no acute distress. The rest of the physical examination is deferred to her primary attending.  LABORATORY  DATA:  Chemistries are within normal limits. Blood alcohol level is 0. LFTs are within normal limits. TSH is 0.7. Urine tox screen negative for substances. CBC is within normal limits. Urinalysis is not suggestive of urinary tract infection. Serum acetaminophen and salicylates are low.   MENTAL STATUS EXAMINATION:  The patient is alert and oriented to person, place, time and situation. She is pleasant, polite and cooperative. She is cool and collected. She is wearing hospital scrubs. She maintains good eye contact. Her speech is of normal rhythm, rate and volume. Mood is fine with a full affect. Thought processing is logical. Thought content: She denies suicidal or homicidal ideation. There are no delusions or paranoia. There are no auditory or visual hallucinations. Her cognition is grossly intact. She registers 3 out of 3 and recalls 3 out of 3 objects after 5 minutes. She can spell cat forward and backward. She knows the current president. Her insight and judgment are questionable.   SUICIDE RISK ASSESSMENT:  This is a patient with a long history of depression and mood instability who came to the hospital complaining of suicidal ideation while she discovered that she is homeless. Suicidal ideation has resolved. The patient convinced her family to let her return home. She is forward thinking and optimistic about the future.   DIAGNOSES: AXIS I: Mood disorder, not otherwise specified.  AXIS II: Cognitive disorder, not otherwise specified.  AXIS III: None.  AXIS IV: Mental illness, primary support, housing, poor coping skills.  AXIS V: Global Assessment of Functioning 45.   PLAN:   1. The patient no longer meets criteria for involuntary inpatient psychiatric commitment. Please discharge as appropriate.  2.  She is to continue Celexa 40 mg daily and Tegretol 200 mg twice daily.  3.  She will be discharged with her family.  4.  She will follow up with Dr. Rogers Blocker at [facility] on Friday.     ____________________________ Ellin Goodie. Jennet Maduro, MD jbp:si D: 01/05/2013 15:21:00 ET T: 01/05/2013 16:28:20 ET JOB#: [id number]  cc: Teagen Bucio B. Jennet Maduro, MD, <Dictator> Shari Prows MD ELECTRONICALLY SIGNED 02/10/2013 6:34

## 2015-04-06 NOTE — Consult Note (Signed)
Brief Consult Note: Diagnosis: Major depressive disorder.   Patient was seen by consultant.   Recommend further assessment or treatment.   Orders entered.   Comments: [name] nhas a h/o depression. She is infected with scabies and under treatment.  PLAN: 1. Will admit to psychiatry when treatment completed (after midnight) and bed available.  Electronic Signatures: Kristine LineaPucilowska, Jolanta (MD)  (Signed 20-Jan-14 20:33)  Authored: Brief Consult Note   Last Updated: 20-Jan-14 20:33 by Kristine LineaPucilowska, Jolanta (MD)

## 2015-04-06 NOTE — Consult Note (Signed)
Brief Consult Note: Diagnosis: Mood disorder NOS.   Patient was seen by consultant.   Consult note dictated.   Recommend further assessment or treatment.   Orders entered.   Comments: Ms. Jacqulynn CadetSydow has a h/o untreated depression and mood instability. She came to ER when kicked out of the shelter and homeless. She underwent treatment for scabies in the ER. She was started on medications and tolerates them well.    PLAN: 1. The patient does not meet criteria for IVC. Please discharge as appropriate.   2. The patient will continue on Tegretol for mood stabilization and Celexa for depression. Rx given.  3. She will be discharged with the family.  4. [facility]..  Electronic Signatures: Kristine LineaPucilowska, Alexandrya Chim (MD)  (Signed 22-Jan-14 11:33)  Authored: Brief Consult Note   Last Updated: 22-Jan-14 11:33 by Kristine LineaPucilowska, Luqman Perrelli (MD)

## 2023-06-29 ENCOUNTER — Inpatient Hospital Stay: Admit: 2023-06-29 | Discharge: 2023-06-29 | Payer: PRIVATE HEALTH INSURANCE

## 2023-06-29 DIAGNOSIS — K029 Dental caries, unspecified: Secondary | ICD-10-CM

## 2023-06-29 NOTE — Progress Notes (Signed)
[facility]      Visit Type:  OMS NPV  Pt. Name: Alexis Walsh  Pt. MRN: [id number]  DOB: 10-07-1981              Sex: female  Visit Date:  06/29/2023  Provider:  Mayra Neer, DDS, MS  Resident: Emilio Aspen, DDS  Location of Care: [facility] at [facility]         Chief complaint: My teeth hurt    HPI  Alexis Walsh is a/an 42 y.o. female referred from general dentist for ext of teeth 8 and 12. She points to #5 as well which is cracked and causing pain.      Pt denies swelling, drainage, trismus, NVFC, dyspnea, dysphagia, dysphonia, shortness of breath, chest pain or any other symptoms.    Past Med/Surg/Family/Social History:  Allergies:   Allergies   Allergen Reactions    Latex Rash     Pt states she had a rash all over from some kind of internal insertion of a latex item.     Medical History:     Patient Active Problem List   Diagnosis    Depression    Dysphagia    Gastroesophageal reflux disease    Irritable bowel syndrome with diarrhea    Obesity, Class II, BMI 35-39.9    Thyroid nodule        Medications:   No current outpatient medications on file.     No current facility-administered medications for this encounter.     No past surgical history on file.  No family history on file.  Social History     Occupational History    Not on file   Tobacco Use    Smoking status: Not on file    Smokeless tobacco: Not on file   Substance and Sexual Activity    Alcohol use: Not on file    Drug use: Not on file    Sexual activity: Not on file         ROS:     Vitals:    06/29/23 0904   BP: 98/60     Body mass index is 33.73 kg/m.    Review of Systems: 10-point ROS completed and is negative except noted in HPI.         Objective:     Maxillofacial:   Atraumatic  Normo-cephalic  No facial swelling  No cervical masses or LAD  No pain to digital palpation - bilaterally  No clicking/popping/crepitus of TMJ  Normal anterior and laterotrusive movements  No  trismus  CN II-XII intact    Oral:   Normal salivary flow, mucosa moist and pink  No vestibular edema/swelling/erythema  No uvular deviation, FOM soft and non-tender  No signs of acute infection  No purulence or drainage or fistulae noted  No soft tissue pathology  Gross caries most remaining dentition. 5, 8, 12 nonrestorable.     Airway  Thyromental distance: > 6 cm  Maximal incisal opening: > 40 mm  Tongue Size: Normal  Mallampati Classification: II    Neck: no significant adenopathy, no scars, thyroid normal size  Chest/Respiratory: no gross deformities, no apparent respiratory distress, clear to auscultation, no wheezes, rales, ronchi  Cardiovascular: normal rate and rhythm; no murmurs, rubs, gallops  Neuro: cranial nerves grossly intact, sensation grossly intact, non-focal, station & gait normal, appropriate mental status  Psych: affect and mood appropriate, normal interaction    Radiographic Evaluation/Imaging  Maxillary sinuses are equal in size and radiodensity. Mandibular condyles are well-formed and seated in the glenoid fossa. Gross caries most remaining dentition. 5, 8, 12 nonrestorable.  No other radiographic evidence of maxillary or mandibular pathology.           Assessment/Plan:     ASA Classification: 2    Alexis Walsh is a/an 42 y.o. female referred from general dentist for ext of teeth 8 and 12. She points to #5 as well which is cracked and causing pain. 5, 8, and 12 are nonrestorable due to caries. Discussed the procedure in detail, including the risks, benefits and alternatives of the procedure including no treatment. Pt elects to proceed with the recommended procedure under IVS. Patient is a candidate for IVS based on airway exam and reported PMH.   Pertinent med hx: GERD    Return for extraction of teeth 5, 8, 12.  Informed consent will be obtained on day of surgery.  Pre-operative instructions: NPO and responsible adult escort.    Referral for reference:         Emilio Aspen, DDS  06/29/2023  9:27 AM  [facility]  [facility] AT [facility]  [address]  [place] [address]  Dept: [phone]  Loc: [phone]

## 2023-06-29 NOTE — Progress Notes (Addendum)
 OMFS Treatment Plan     Anesthesia Type:sedation    Procedure: Simple ext 1-3    Schedule Guidelines: Schedule in normal time    Pre-Procedural Instructions: No special instructions    Were home medications reviewed and instructions provided? Yes    Estimated Procedure Time: 30 minutes

## 2023-10-26 ENCOUNTER — Ambulatory Visit: Payer: PRIVATE HEALTH INSURANCE

## 2024-05-25 ENCOUNTER — Ambulatory Visit: Payer: Medicaid (Managed Care)
